# Patient Record
Sex: Female | Born: 1995 | Race: White | Hispanic: No | Marital: Single | Smoking: Never smoker
Health system: Southern US, Community
[De-identification: ages and names within clinical notes are randomized; demographics above are authoritative.]

## PROBLEM LIST (undated history)

## (undated) DIAGNOSIS — F32A Depression, unspecified: Secondary | ICD-10-CM

## (undated) DIAGNOSIS — F419 Anxiety disorder, unspecified: Secondary | ICD-10-CM

## (undated) DIAGNOSIS — F329 Major depressive disorder, single episode, unspecified: Secondary | ICD-10-CM

## (undated) DIAGNOSIS — F909 Attention-deficit hyperactivity disorder, unspecified type: Secondary | ICD-10-CM

---

## 2014-11-07 ENCOUNTER — Encounter: Payer: Self-pay | Admitting: *Deleted

## 2014-11-07 ENCOUNTER — Emergency Department: Payer: BLUE CROSS/BLUE SHIELD

## 2014-11-07 ENCOUNTER — Emergency Department
Admission: EM | Admit: 2014-11-07 | Discharge: 2014-11-07 | Disposition: A | Discharge status: Home / Self Care | Payer: BLUE CROSS/BLUE SHIELD | Attending: Emergency Medicine | Admitting: Emergency Medicine

## 2014-11-07 DIAGNOSIS — Z79899 Other long term (current) drug therapy: Secondary | ICD-10-CM | POA: Insufficient documentation

## 2014-11-07 DIAGNOSIS — R55 Syncope and collapse: Secondary | ICD-10-CM | POA: Diagnosis present

## 2014-11-07 DIAGNOSIS — Z3202 Encounter for pregnancy test, result negative: Secondary | ICD-10-CM | POA: Insufficient documentation

## 2014-11-07 HISTORY — DX: Anxiety disorder, unspecified: F41.9

## 2014-11-07 HISTORY — DX: Attention-deficit hyperactivity disorder, unspecified type: F90.9

## 2014-11-07 HISTORY — DX: Depression, unspecified: F32.A

## 2014-11-07 HISTORY — DX: Major depressive disorder, single episode, unspecified: F32.9

## 2014-11-07 LAB — URINE DRUG SCREEN, QUALITATIVE (ARMC ONLY)
AMPHETAMINES, UR SCREEN: NOT DETECTED
BENZODIAZEPINE, UR SCRN: NOT DETECTED
Barbiturates, Ur Screen: NOT DETECTED
Cannabinoid 50 Ng, Ur ~~LOC~~: NOT DETECTED
Cocaine Metabolite,Ur ~~LOC~~: NOT DETECTED
MDMA (ECSTASY) UR SCREEN: NOT DETECTED
METHADONE SCREEN, URINE: NOT DETECTED
OPIATE, UR SCREEN: NOT DETECTED
PHENCYCLIDINE (PCP) UR S: NOT DETECTED
Tricyclic, Ur Screen: NOT DETECTED

## 2014-11-07 LAB — URINALYSIS COMPLETE WITH MICROSCOPIC (ARMC ONLY)
Bacteria, UA: NONE SEEN
Bilirubin Urine: NEGATIVE
Glucose, UA: NEGATIVE mg/dL
HGB URINE DIPSTICK: NEGATIVE
Ketones, ur: NEGATIVE mg/dL
LEUKOCYTES UA: NEGATIVE
Nitrite: NEGATIVE
PH: 7 (ref 5.0–8.0)
PROTEIN: NEGATIVE mg/dL
SPECIFIC GRAVITY, URINE: 1.004 — AB (ref 1.005–1.030)

## 2014-11-07 LAB — COMPREHENSIVE METABOLIC PANEL
ALT: 21 U/L (ref 14–54)
ANION GAP: 7 (ref 5–15)
AST: 33 U/L (ref 15–41)
Albumin: 3.6 g/dL (ref 3.5–5.0)
Alkaline Phosphatase: 39 U/L (ref 38–126)
BUN: 14 mg/dL (ref 6–20)
CO2: 26 mmol/L (ref 22–32)
Calcium: 8.5 mg/dL — ABNORMAL LOW (ref 8.9–10.3)
Chloride: 104 mmol/L (ref 101–111)
Creatinine, Ser: 0.8 mg/dL (ref 0.44–1.00)
Glucose, Bld: 93 mg/dL (ref 65–99)
POTASSIUM: 3.8 mmol/L (ref 3.5–5.1)
SODIUM: 137 mmol/L (ref 135–145)
Total Bilirubin: 0.3 mg/dL (ref 0.3–1.2)
Total Protein: 6.4 g/dL — ABNORMAL LOW (ref 6.5–8.1)

## 2014-11-07 LAB — CBC WITH DIFFERENTIAL/PLATELET
BASOS ABS: 0.1 10*3/uL (ref 0–0.1)
Basophils Relative: 1 %
Eosinophils Absolute: 0.1 10*3/uL (ref 0–0.7)
Eosinophils Relative: 2 %
HCT: 37.2 % (ref 35.0–47.0)
HEMOGLOBIN: 12.8 g/dL (ref 12.0–16.0)
LYMPHS ABS: 2.1 10*3/uL (ref 1.0–3.6)
LYMPHS PCT: 35 %
MCH: 31.5 pg (ref 26.0–34.0)
MCHC: 34.3 g/dL (ref 32.0–36.0)
MCV: 91.8 fL (ref 80.0–100.0)
Monocytes Absolute: 0.8 10*3/uL (ref 0.2–0.9)
Monocytes Relative: 13 %
NEUTROS PCT: 49 %
Neutro Abs: 3 10*3/uL (ref 1.4–6.5)
PLATELETS: 235 10*3/uL (ref 150–440)
RBC: 4.06 MIL/uL (ref 3.80–5.20)
RDW: 12.5 % (ref 11.5–14.5)
WBC: 6 10*3/uL (ref 3.6–11.0)

## 2014-11-07 LAB — PREGNANCY, URINE: PREG TEST UR: NEGATIVE

## 2014-11-07 LAB — TROPONIN I

## 2014-11-07 MED ORDER — CITALOPRAM HYDROBROMIDE 40 MG PO TABS
40.0000 mg | ORAL_TABLET | Freq: Every day | ORAL | Status: AC
Start: 2014-11-07 — End: 2015-11-07

## 2014-11-07 MED ORDER — AMPHETAMINE-DEXTROAMPHET ER 15 MG PO CP24: 15.0000 mg | ORAL_CAPSULE | Freq: Every day | ORAL | Status: AC

## 2014-11-07 NOTE — Discharge Instructions (Signed)
Please make sure you eating and drinking regularly. I have refillED your medicines go ahead and get the prescriptions filled and start taking the medicine  as soon as possible. Resume taking your medicines immediately. Please be sure to follow up with [facility]. If you feel woozy or lightheaded make sure you sit down or lay down. I suggest you follow up with the cardiologist to further investigate or passing out.

## 2014-11-07 NOTE — ED Notes (Signed)
Pt arrived via EMS from [facility] after multiple syncopal episodes. Pt was at dance practice after feeling "off" all day when pt began passing out and per EMS was posturing and pointing toes. Pt denies hx of seizures but reports not having taken medications for the past 24 hours. Pt takes 30 mg adderal every morning and then  PRN since 19 years old. Pt reports also taking antidepressants and antianxiety as well as birth control. EMS administered 250 ml NS in route. Pt is talking and alert and oriented upon arrival. Pt reports remembering events in between syncopal episodes. Pt reports having a hx of syncope.

## 2014-11-07 NOTE — ED Notes (Signed)
Pt reports feeling slightly "jittery." Pt state feeling as though hands are shaking. Pts hand are shaking slightly.

## 2014-11-07 NOTE — ED Notes (Signed)
Pt education on follow up completed by MD and RN. Pt in no acute distress. Socks offered to pt upon d/c but pt refused. Pt ambulated independently and without difficulty.

## 2014-11-07 NOTE — ED Provider Notes (Signed)
[facility] Emergency Department Ryann Pauli Note  ____________________________________________  Time seen: Approximately 5:37 PM  I have reviewed the triage vital signs and the nursing notes.   HISTORY  Chief Complaint Loss of Consciousness    HPI Katelyn Clark is a 19 y.o. female patient reports she was at dance practice. When she passed out. Her friend and when the dance instructor's came with her. Between them and EMS there were 7 reported syncope episodes the first one patient was standing up and collapsed in her friend's arms. She lowered her to the floor. Then patient began to sit up and sat up and was talking and slumped over patient reportedly had fluttering eyelids first 3 times. EMS reported that she pointed her toes couple times when she passed out from a saw her. Patient was not postictal at any time. Patient reports she felt slightly hazy before she passed out. Patient reports she feels slightly hazy when she doesn't take her into the present she does not know what happens when she doesn't take her Adderall she is been on that since at least age 82. Patient has been off her medicines for 2 days because she ran out and her mother forgot to send her any she says. Patient has eaten breakfast and had something for lunch patient is also been drinking fluids having had 3 bottles of water today. Patient reports at this time she feels well in fact gets up and walks normally to the bathroom.She denies having any nausea vomiting headache coughing chest pain or any other complaints.   Past Medical History  Diagnosis Date  . ADHD (attention deficit hyperactivity disorder)   . Anxiety   . Depression     There are no active problems to display for this patient.   History reviewed. No pertinent past surgical history.  Current Outpatient Rx  Name  Route  Sig  Dispense  Refill  . amphetamine-dextroamphetamine (ADDERALL XR) 30 MG 24 hr capsule   Oral   Take 30 mg  by mouth daily.         Marland Kitchen amphetamine-dextroamphetamine (ADDERALL) 15 MG tablet   Oral   Take 15 mg by mouth as needed.         . busPIRone (BUSPAR) 7.5 MG tablet   Oral   Take 15 mg by mouth 2 (two) times daily.         . citalopram (CELEXA) 40 MG tablet   Oral   Take 40 mg by mouth daily.         Marland Kitchen amphetamine-dextroamphetamine (ADDERALL XR) 15 MG 24 hr capsule   Oral   Take 1 capsule by mouth daily. TAKE 2 IN THE MORNING AND ONE LATER AS YOU HAVE BEEN   10 capsule   0   . citalopram (CELEXA) 40 MG tablet   Oral   Take 1 tablet (40 mg total) by mouth daily.   30 tablet   2     Allergies Haldol  History reviewed. No pertinent family history.  Social History Social History  Substance Use Topics  . Smoking status: Never Smoker   . Smokeless tobacco: None  . Alcohol Use: Yes    Constitutional: No fever/chills Eyes: No visual changes. ENT: No sore throat. Cardiovascular: Denies chest pain. Respiratory: Denies shortness of breath. Gastrointestinal: No abdominal pain.  No nausea, no vomiting.  No diarrhea.  No constipation. Genitourinary: Negative for dysuria. Musculoskeletal: Negative for back pain. Skin: Negative for rash. Neurological: Negative for headaches, focal weakness  or numbness.  10-point ROS otherwise negative.  ____________________________________________   PHYSICAL EXAM:  VITAL SIGNS: ED Triage Vitals  Enc Vitals Group     BP 11/07/14 1642 124/89 mmHg     Pulse Rate 11/07/14 1642 77     Resp 11/07/14 1642 16     Temp 11/07/14 1642 98.3 F (36.8 C)     Temp Source 11/07/14 1642 Oral     SpO2 11/07/14 1642 100 %     Weight 11/07/14 1642 140 lb (63.504 kg)     Height 11/07/14 1642 5' 7.5" (1.715 m)     Head Cir --      Peak Flow --      Pain Score --      Pain Loc --      Pain Edu? --      Excl. in GC? --     Constitutional: Alert and oriented. Well appearing and in no acute distress. Eyes: Conjunctivae are normal. PERRL.  EOMI. Head: Atraumatic. Nose: No congestion/rhinnorhea. Mouth/Throat: Mucous membranes are moist.  Oropharynx non-erythematous. Neck: No stridor Cardiovascular: Normal rate, regular rhythm. Grossly normal heart sounds.  Good peripheral circulation. Respiratory: Normal respiratory effort.  No retractions. Lungs CTAB. Gastrointestinal: Soft and nontender. No distention. No abdominal bruits. No CVA tenderness. No organomegaly Musculoskeletal: No lower extremity tenderness nor edema.  No joint effusions. Neurologic:  Normal speech and language. No gross focal neurologic deficits are appreciated. No gait instability. Cranial nerves II through XII are intact. Nose and rapid alternating movements in the hands are normal motor strength is 5 over 5 throughout sensation is intact throughout Skin:  Skin is warm, dry and intact. No rash noted. Psychiatric: Mood and affect are normal. Speech and behavior are normal.  ____________________________________________   LABS (all labs ordered are listed, but only abnormal results are displayed)  Labs Reviewed  URINALYSIS COMPLETEWITH MICROSCOPIC ([facility] ONLY) - Abnormal; Notable for the following:    Color, Urine STRAW (*)    APPearance CLEAR (*)    Specific Gravity, Urine 1.004 (*)    Squamous Epithelial / LPF 0-5 (*)    All other components within normal limits  COMPREHENSIVE METABOLIC PANEL - Abnormal; Notable for the following:    Calcium 8.5 (*)    Total Protein 6.4 (*)    All other components within normal limits  TROPONIN I  CBC WITH DIFFERENTIAL/PLATELET  PREGNANCY, URINE  URINE DRUG SCREEN, QUALITATIVE ([facility] ONLY)  CBG MONITORING, ED   ____________________________________________  EKG  EKG read and interpreted by me shows normal sinus rhythm at a rate of 73 normal axis no acute changes ____________________________________________  RADIOLOGY  CT of the head and chest x-ray were read by as normal by the  radiologist ____________________________________________   PROCEDURES    ____________________________________________   INITIAL IMPRESSION / ASSESSMENT AND PLAN / ED COURSE  Pertinent labs & imaging results that were available during my care of the patient were reviewed by me and considered in my medical decision making (see chart for details). Prescriptions were refilled for 10 days ____________________________________________   FINAL CLINICAL IMPRESSION(S) / ED DIAGNOSES  Final diagnoses:  Collapse      Arnaldo Natal, MD 11/07/14 2014

## 2014-11-07 NOTE — ED Notes (Signed)
Pt to bathroom to attempt to urinate again.

## 2014-11-07 NOTE — ED Notes (Signed)
Pt able to ambulate independently to the restroom without difficulty. MD with pt when standing from bed and assessed pts ability to ambulate.

## 2016-03-29 IMAGING — CT CT HEAD W/O CM
1 series · 16 of 30 positions shown, 20 images · non-contrast
Comparison: No priors.

CLINICAL DATA: 19-year-old female with multiple syncopal episodes
today after dance practice.

EXAM:
CT HEAD WITHOUT CONTRAST
TECHNIQUE: Contiguous axial images were obtained from the base of the skull
through the vertex without intravenous contrast.

[Series 2: head wo · axial · 0.39mm/px · z∈[-45,+81]mm · 16 of 32 slices shown, 20 images]
[im 2/32  brain]
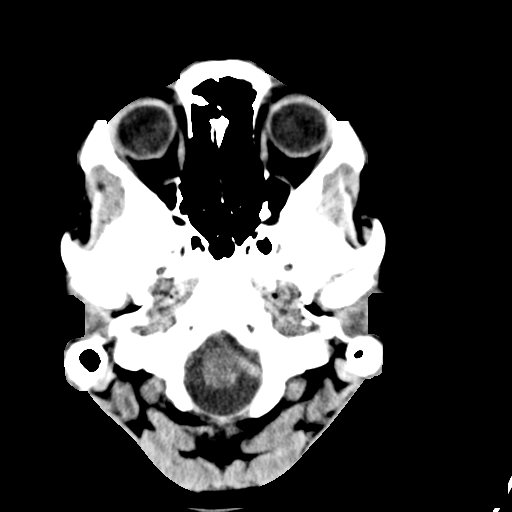
[im 2/32  bone]
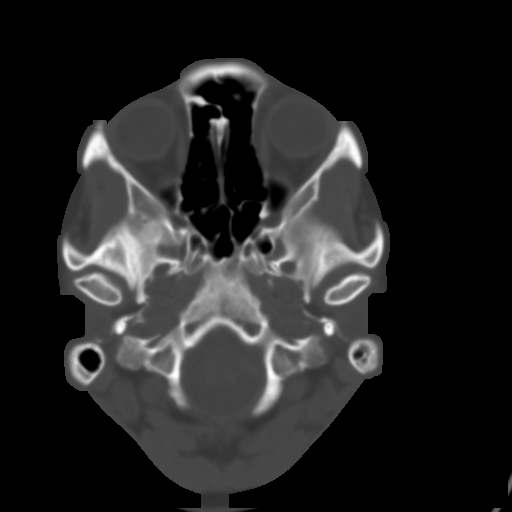
[im 4/32  brain]
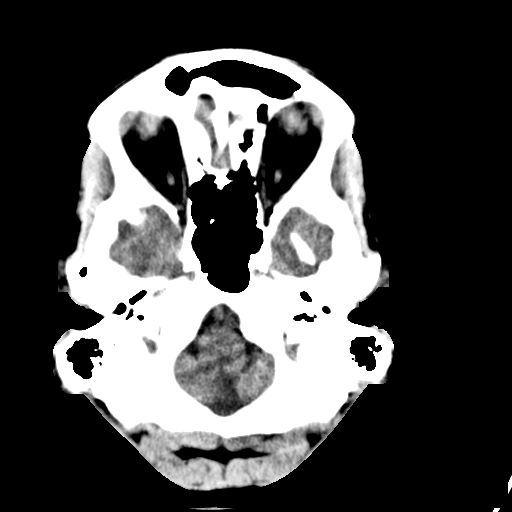
[im 6/32  brain]
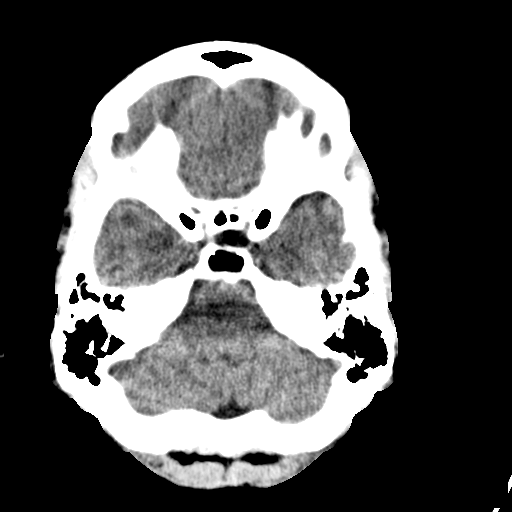
[im 8/32  brain]
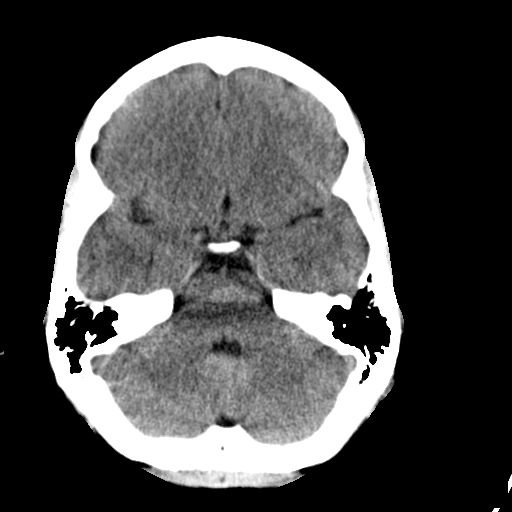
[im 9/32  brain]
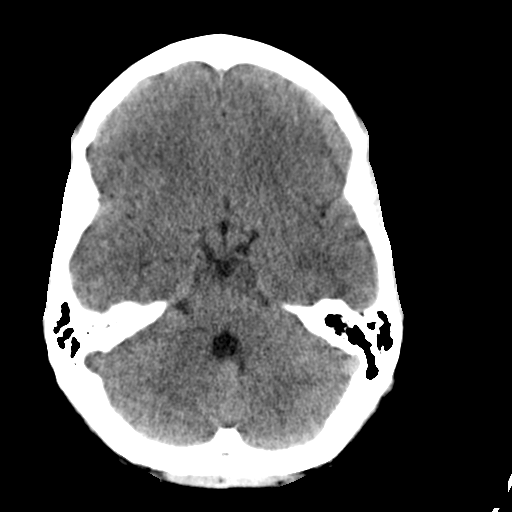
[im 9/32  bone]
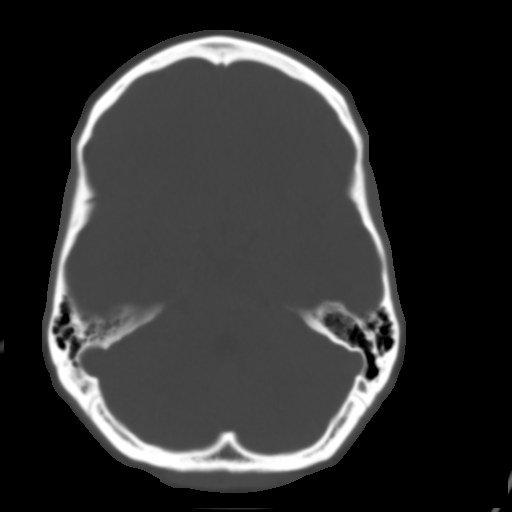
[im 11/32  brain]
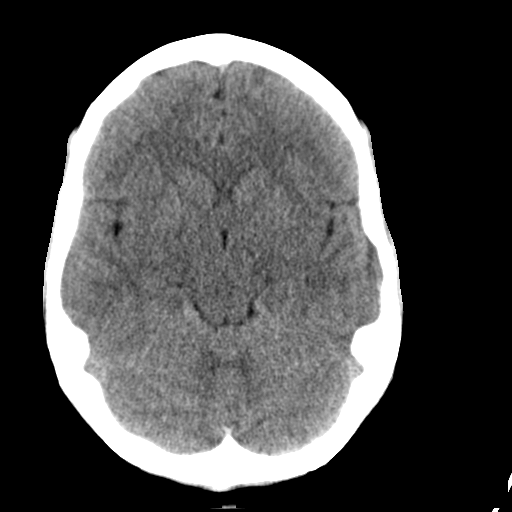
[im 13/32  brain]
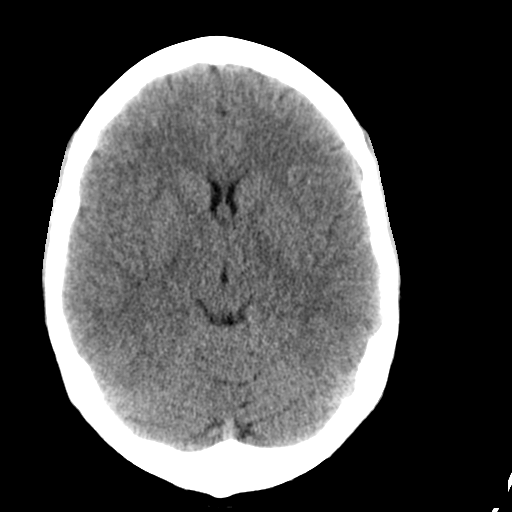
[im 15/32  brain]
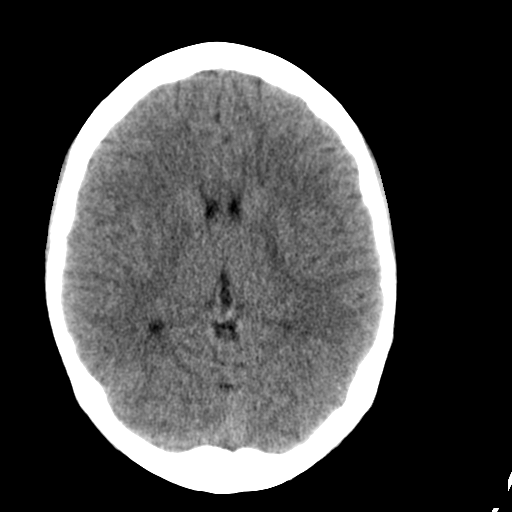
[im 17/32  brain]
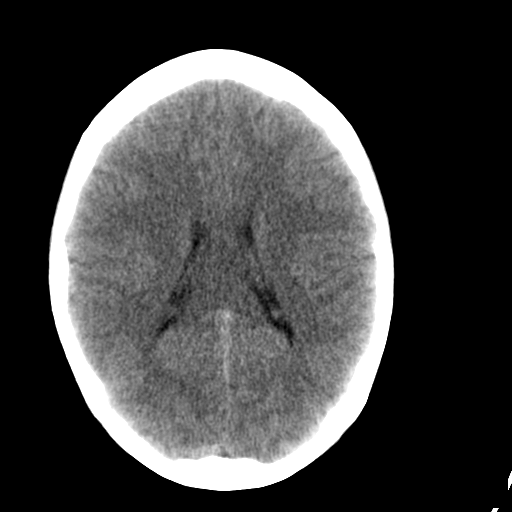
[im 17/32  bone]
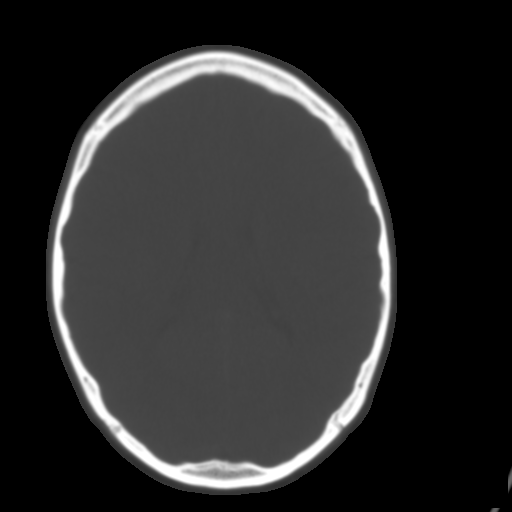
[im 19/32  brain]
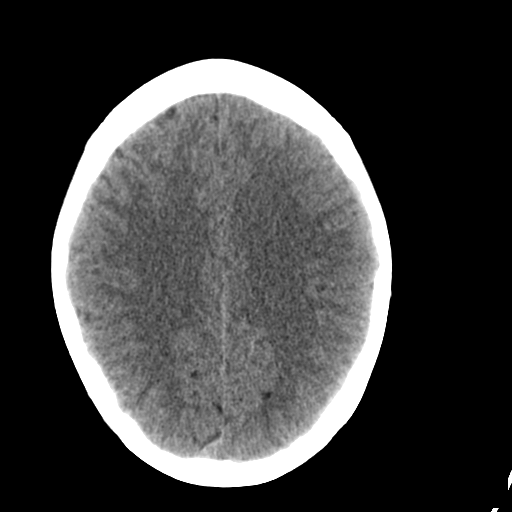
[im 21/32  brain]
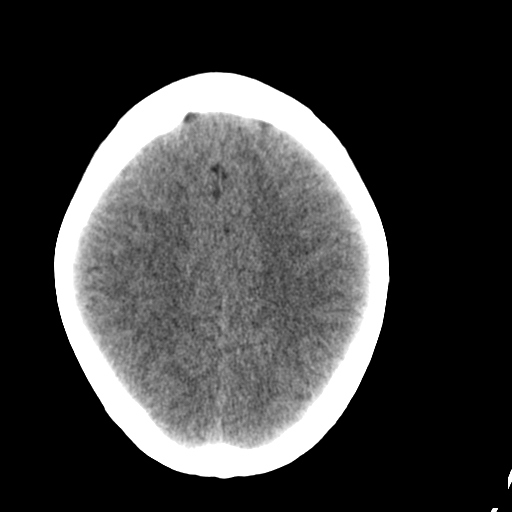
[im 23/32  brain]
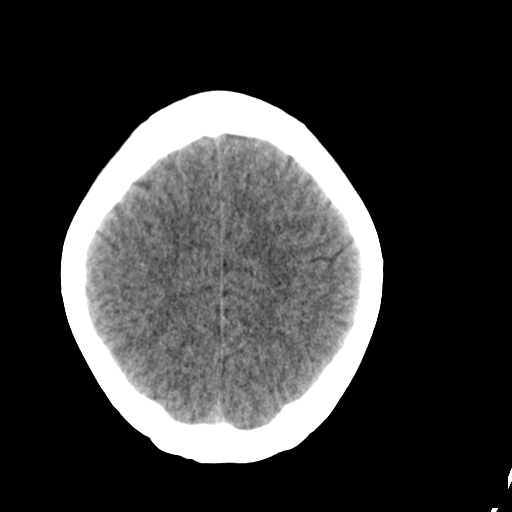
[im 24/32  brain]
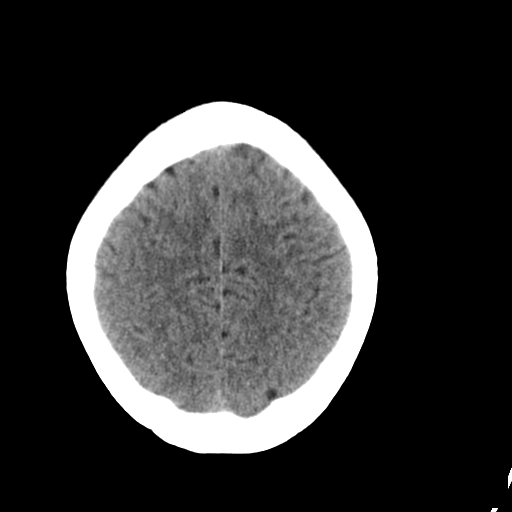
[im 24/32  bone]
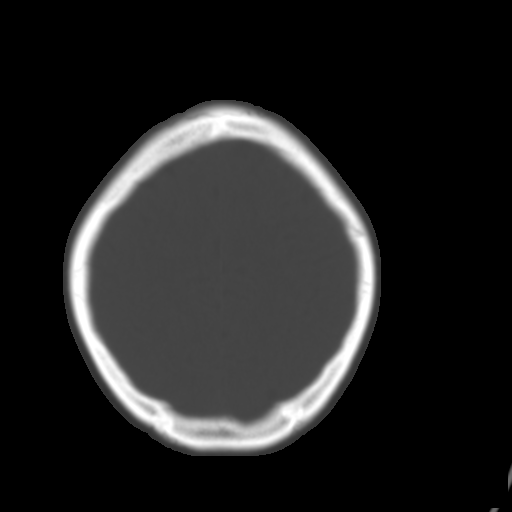
[im 26/32  brain]
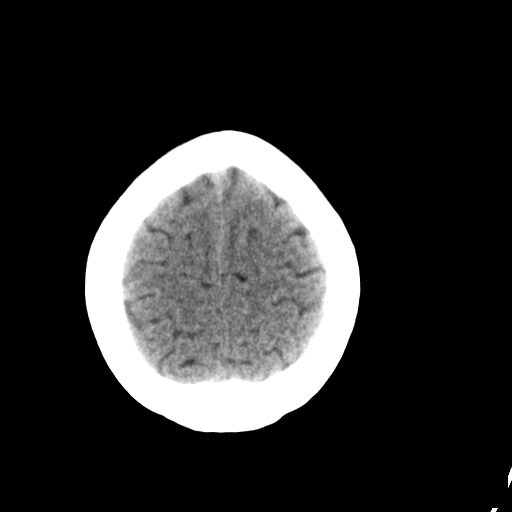
[im 28/32  brain]
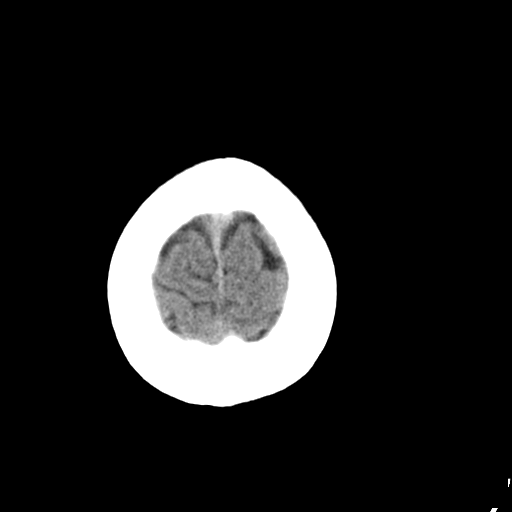
[im 30/32  brain]
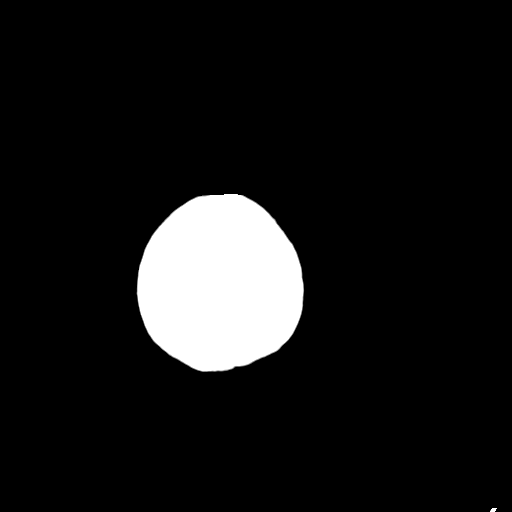

[16 of 30 positions shown; findings below may reference images not displayed]

FINDINGS: No acute intracranial abnormalities. Specifically, no evidence of
acute intracranial hemorrhage, no definite findings of
acute/subacute cerebral ischemia, no mass, mass effect,
hydrocephalus or abnormal intra or extra-axial fluid collections.
Visualized paranasal sinuses and mastoids are well pneumatized. No
acute displaced skull fractures are identified.
IMPRESSION: *No acute intracranial abnormalities.
*The appearance of the brain is normal.

## 2016-03-29 IMAGING — CR DG CHEST 2V
1 series · 2 of 2 positions shown · non-contrast
Comparison: None.

CLINICAL DATA: Several syncopal episodes.

EXAM:
CHEST  2 VIEW

[Series 1: dg chest 2 view · 0.14mm/px · 2 of 2 slices shown]
[im 1/2]
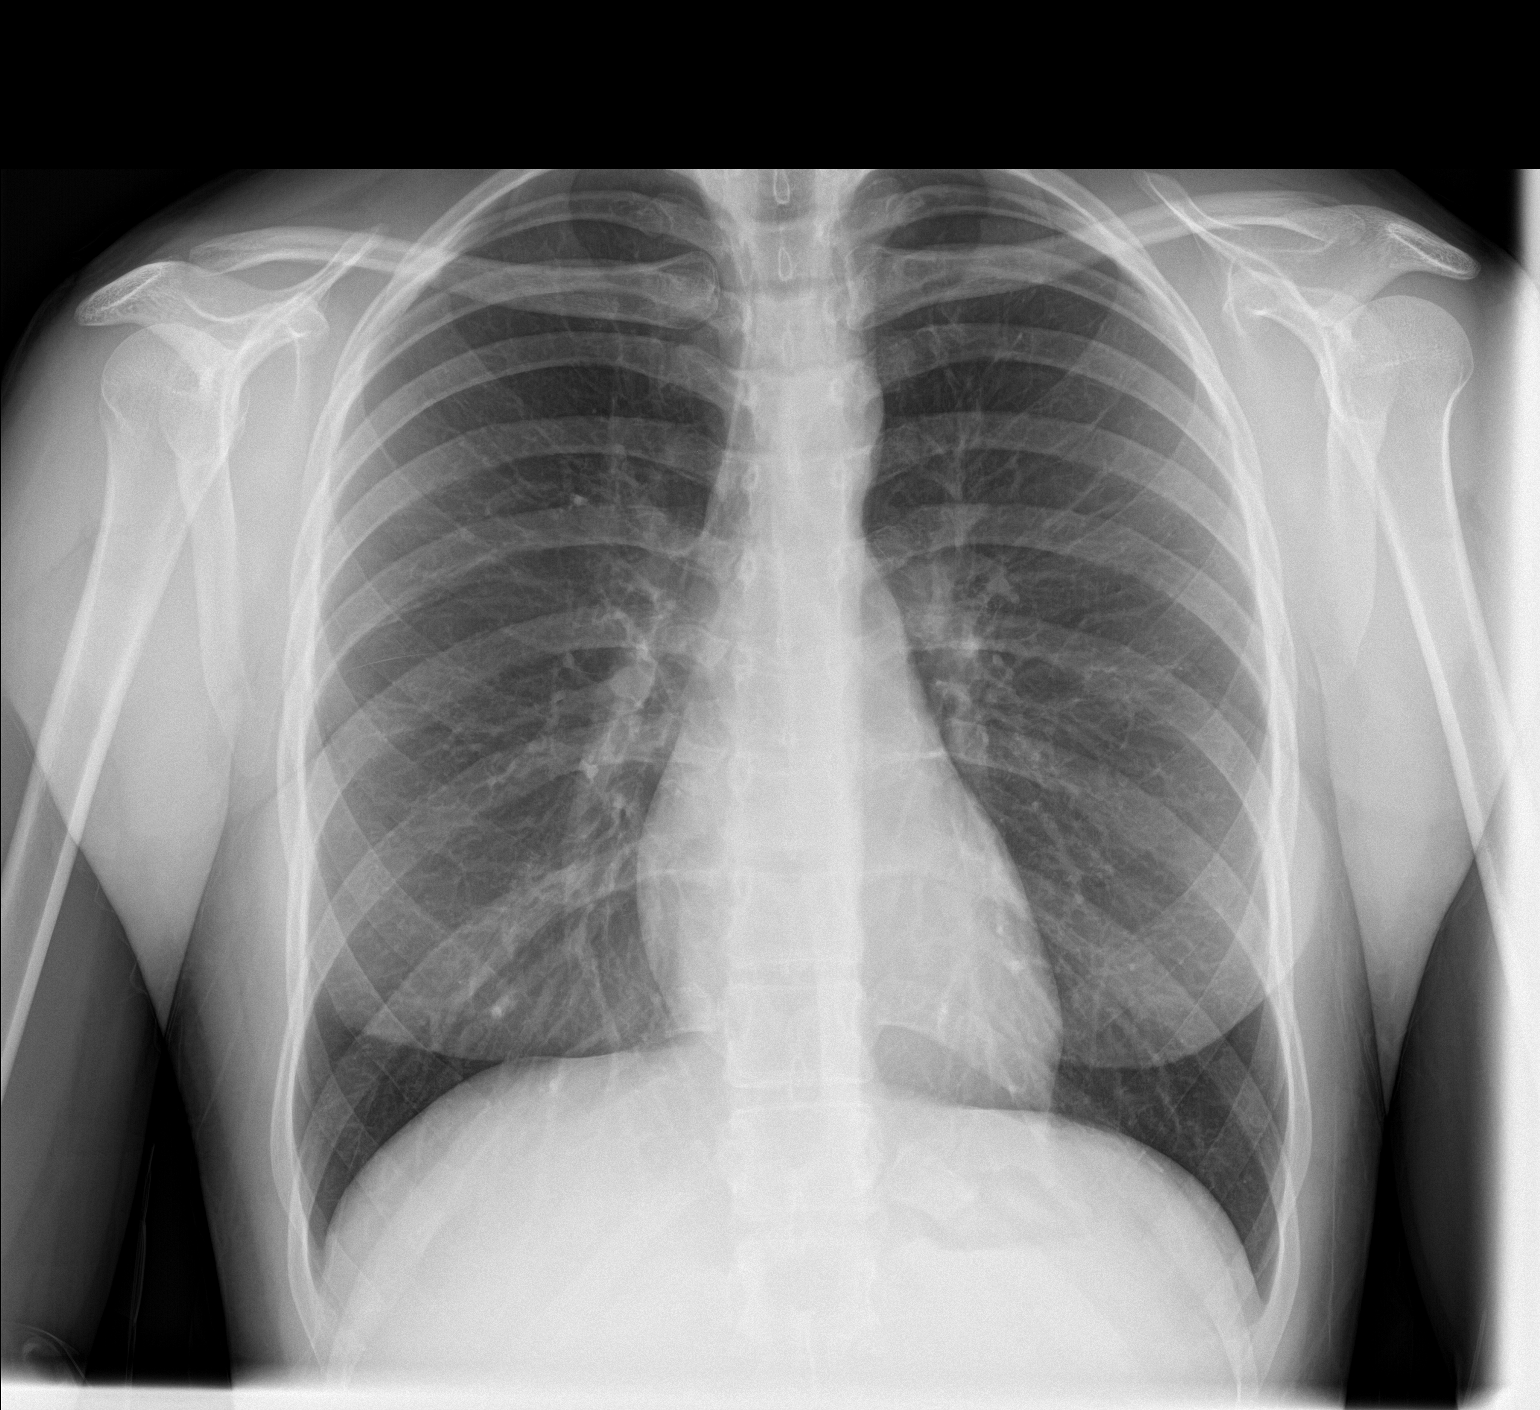
[im 2/2]
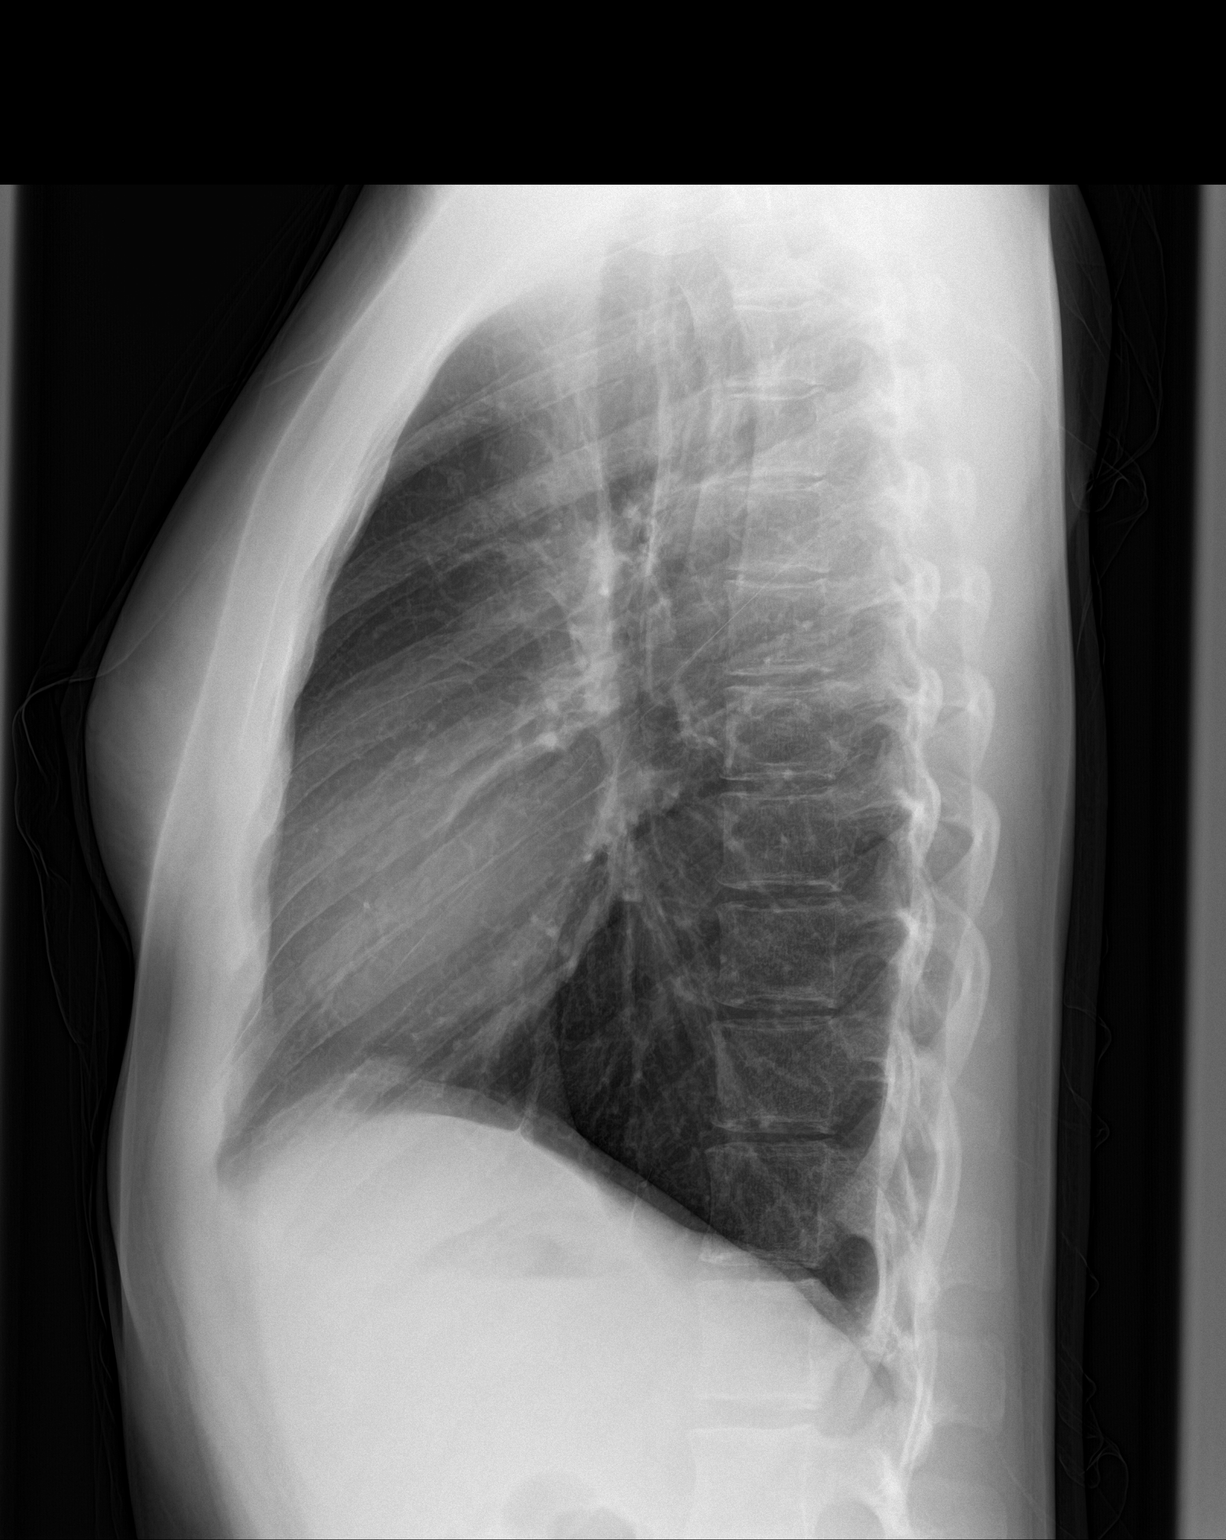

[2 of 2 positions shown; findings below may reference images not displayed]

FINDINGS: The heart size and mediastinal contours are within normal limits.
Both lungs are clear. The visualized skeletal structures are
unremarkable.
IMPRESSION: Normal chest x-ray.
